# Patient Record
Sex: Male | Born: 1995 | Race: White | Hispanic: No | Marital: Married | Smoking: Former smoker
Health system: Southern US, Community
[De-identification: ages and names within clinical notes are randomized; demographics above are authoritative.]

## PROBLEM LIST (undated history)

## (undated) DIAGNOSIS — F909 Attention-deficit hyperactivity disorder, unspecified type: Secondary | ICD-10-CM

## (undated) DIAGNOSIS — G4730 Sleep apnea, unspecified: Secondary | ICD-10-CM

---
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
## 2015-11-28 ENCOUNTER — Encounter (HOSPITAL_COMMUNITY): Payer: Self-pay | Admitting: *Deleted

## 2015-11-28 ENCOUNTER — Emergency Department (HOSPITAL_COMMUNITY): Payer: Managed Care, Other (non HMO)

## 2015-11-28 ENCOUNTER — Emergency Department (HOSPITAL_COMMUNITY)
Admission: EM | Admit: 2015-11-28 | Discharge: 2015-11-28 | Disposition: A | Discharge status: Home / Self Care | Payer: Managed Care, Other (non HMO) | Attending: Emergency Medicine | Admitting: Emergency Medicine

## 2015-11-28 DIAGNOSIS — Y999 Unspecified external cause status: Secondary | ICD-10-CM | POA: Diagnosis not present

## 2015-11-28 DIAGNOSIS — Y939 Activity, unspecified: Secondary | ICD-10-CM | POA: Insufficient documentation

## 2015-11-28 DIAGNOSIS — M25511 Pain in right shoulder: Secondary | ICD-10-CM | POA: Diagnosis present

## 2015-11-28 DIAGNOSIS — Y92410 Unspecified street and highway as the place of occurrence of the external cause: Secondary | ICD-10-CM | POA: Diagnosis not present

## 2015-11-28 MED ORDER — DICLOFENAC SODIUM 50 MG PO TBEC: 50.0000 mg | DELAYED_RELEASE_TABLET | Freq: Two times a day (BID) | ORAL | 0 refills | Status: DC

## 2015-11-28 MED ORDER — IBUPROFEN 400 MG PO TABS
800.0000 mg | ORAL_TABLET | Freq: Once | ORAL | Status: AC
  Administered 2015-11-28: 800 mg via ORAL
  Filled 2015-11-28: qty 2
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
## 2015-11-28 NOTE — ED Notes (Signed)
Pt is in stable condition upon d/c and ambulates from ED. 
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
## 2016-03-15 ENCOUNTER — Encounter (HOSPITAL_COMMUNITY): Payer: Self-pay

## 2016-03-15 ENCOUNTER — Emergency Department (HOSPITAL_COMMUNITY): Payer: Managed Care, Other (non HMO)

## 2016-03-15 ENCOUNTER — Emergency Department (HOSPITAL_COMMUNITY)
Admission: EM | Admit: 2016-03-15 | Discharge: 2016-03-15 | Disposition: A | Discharge status: Home / Self Care | Payer: Managed Care, Other (non HMO) | Attending: Emergency Medicine | Admitting: Emergency Medicine

## 2016-03-15 DIAGNOSIS — Z79899 Other long term (current) drug therapy: Secondary | ICD-10-CM | POA: Insufficient documentation

## 2016-03-15 DIAGNOSIS — F909 Attention-deficit hyperactivity disorder, unspecified type: Secondary | ICD-10-CM | POA: Insufficient documentation

## 2016-03-15 DIAGNOSIS — W34010A Accidental discharge of airgun, initial encounter: Secondary | ICD-10-CM | POA: Diagnosis not present

## 2016-03-15 DIAGNOSIS — Y939 Activity, unspecified: Secondary | ICD-10-CM | POA: Diagnosis not present

## 2016-03-15 DIAGNOSIS — F17200 Nicotine dependence, unspecified, uncomplicated: Secondary | ICD-10-CM | POA: Diagnosis not present

## 2016-03-15 DIAGNOSIS — S61002A Unspecified open wound of left thumb without damage to nail, initial encounter: Secondary | ICD-10-CM | POA: Insufficient documentation

## 2016-03-15 DIAGNOSIS — M795 Residual foreign body in soft tissue: Secondary | ICD-10-CM

## 2016-03-15 DIAGNOSIS — Y999 Unspecified external cause status: Secondary | ICD-10-CM | POA: Insufficient documentation

## 2016-03-15 DIAGNOSIS — Y929 Unspecified place or not applicable: Secondary | ICD-10-CM | POA: Diagnosis not present

## 2016-03-15 MED ORDER — CEPHALEXIN 500 MG PO CAPS: 500.0000 mg | ORAL_CAPSULE | Freq: Two times a day (BID) | ORAL | 0 refills | Status: DC

## 2016-03-15 MED ORDER — TETANUS-DIPHTH-ACELL PERTUSSIS 5-2.5-18.5 LF-MCG/0.5 IM SUSP
0.5000 mL | Freq: Once | INTRAMUSCULAR | Status: AC
  Administered 2016-03-15: 0.5 mL via INTRAMUSCULAR
  Filled 2016-03-15: qty 0.5

## 2016-03-15 NOTE — Progress Notes (Signed)
Orthopedic Tech Progress Note Patient Details:  Dustin AlesRyan Zavala Dec 24, 1995 [id number]  Ortho Devices Type of Ortho Device: Thumb velcro splint Ortho Device/Splint Location: LUE Ortho Device/Splint Interventions: Ordered, Application   Jennye MoccasinHughes, Briea Mcenery Craig 03/15/2016, 5:44 PM

## 2016-03-15 NOTE — ED Notes (Signed)
Pt verbalized understanding of d/c instructions and has no further questions. Pt stable and NAD. Pt to take antibiotics and see hand surgeon outpatient. VSS

## 2016-03-15 NOTE — ED Notes (Signed)
Ortho tech in room 
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
## 2018-01-22 ENCOUNTER — Emergency Department (HOSPITAL_COMMUNITY)
Admission: EM | Admit: 2018-01-22 | Discharge: 2018-01-22 | Disposition: A | Discharge status: Home / Self Care | Payer: Managed Care, Other (non HMO) | Attending: Emergency Medicine | Admitting: Emergency Medicine

## 2018-01-22 ENCOUNTER — Encounter (HOSPITAL_COMMUNITY): Payer: Self-pay

## 2018-01-22 DIAGNOSIS — Z79899 Other long term (current) drug therapy: Secondary | ICD-10-CM | POA: Insufficient documentation

## 2018-01-22 DIAGNOSIS — Z87891 Personal history of nicotine dependence: Secondary | ICD-10-CM | POA: Insufficient documentation

## 2018-01-22 DIAGNOSIS — R45851 Suicidal ideations: Secondary | ICD-10-CM

## 2018-01-22 DIAGNOSIS — F329 Major depressive disorder, single episode, unspecified: Secondary | ICD-10-CM

## 2018-01-22 DIAGNOSIS — F321 Major depressive disorder, single episode, moderate: Secondary | ICD-10-CM | POA: Insufficient documentation

## 2018-01-22 DIAGNOSIS — F32A Depression, unspecified: Secondary | ICD-10-CM

## 2018-01-22 DIAGNOSIS — F419 Anxiety disorder, unspecified: Secondary | ICD-10-CM | POA: Insufficient documentation

## 2018-01-22 LAB — COMPREHENSIVE METABOLIC PANEL
ALK PHOS: 52 U/L (ref 38–126)
ALT: 18 U/L (ref 0–44)
ANION GAP: 12 (ref 5–15)
AST: 16 U/L (ref 15–41)
Albumin: 4.4 g/dL (ref 3.5–5.0)
BUN: 10 mg/dL (ref 6–20)
CALCIUM: 9.2 mg/dL (ref 8.9–10.3)
CO2: 20 mmol/L — AB (ref 22–32)
CREATININE: 0.88 mg/dL (ref 0.61–1.24)
Chloride: 106 mmol/L (ref 98–111)
Glucose, Bld: 103 mg/dL — ABNORMAL HIGH (ref 70–99)
Potassium: 3.5 mmol/L (ref 3.5–5.1)
SODIUM: 138 mmol/L (ref 135–145)
Total Bilirubin: 1 mg/dL (ref 0.3–1.2)
Total Protein: 7.5 g/dL (ref 6.5–8.1)

## 2018-01-22 LAB — CBC
HCT: 47.4 % (ref 39.0–52.0)
Hemoglobin: 15.6 g/dL (ref 13.0–17.0)
MCH: 28.3 pg (ref 26.0–34.0)
MCHC: 32.9 g/dL (ref 30.0–36.0)
MCV: 85.9 fL (ref 80.0–100.0)
PLATELETS: 366 10*3/uL (ref 150–400)
RBC: 5.52 MIL/uL (ref 4.22–5.81)
RDW: 13 % (ref 11.5–15.5)
WBC: 10.6 10*3/uL — ABNORMAL HIGH (ref 4.0–10.5)
nRBC: 0 % (ref 0.0–0.2)

## 2018-01-22 LAB — ETHANOL: Alcohol, Ethyl (B): <10 mg/dL (ref <10)

## 2018-01-22 LAB — ACETAMINOPHEN LEVEL

## 2018-01-22 LAB — SALICYLATE LEVEL

## 2018-01-22 NOTE — BH Assessment (Addendum)
Assessment Note  Dustin Zavala is an 22 y.o. male that presents this date with depression and passive thoughts of self harm. Patient denies any plan or gestures. Patient denies any previous history of a mental health disorder. Patient denies any current SA use although reports past use of Cannabis although is unclear of the time frame or last use. UDS pending. Patient is alert and oriented x 4 and presents with a pleasant affect. Patient denies any S/I, H/I or AVH. Patient denies any thoughts of self harm and has an appointment with [facility] next week on 01/28/18 to address current concerns although patient thought he could "speed things up" if he presented [facility] at [facility]. Patient states this date that due to frustrations at his current employer [employer] that have occurred over the last two days (patient states "it's just hard working there") that he felt his anxiety has needed to be addressed this date. Patient reports current symptoms to include: decreased sleep over the past two days and racing thoughts. Patient is requesting medication assistance to assist with symptom management.  This Clinical research associatewriter staffed case with Arville CareParks FNP who recommended patient be observed and monitored over night with possible medication interventions. Patient declined stating he would follow up with his scheduled appointment next week and is requesting to be discharged. Patient contacts for safety and denies any thoughts of self harm. Per chart review history is limited on this patient. Per notes, patient presents with  thoughts of suicide and anxiety. Patient reports these thoughts have gotten worse over the last few weeks, although this has been going on for roughly a year. No physical attempts on his life, thoughts only. Patient is calm and cooperative in triage. This writer informed Arville CareParks FNP that patient declined any further treatment interventions and Arville Carearks agreed to discharge. Patient will follow up with scheduled appointment next  week.     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 ADHD (attention deficit hyperactivity disorder)     History reviewed. No pertinent surgical history.  Family History: History reviewed. No pertinent family history.  Social History:  reports that he has quit smoking. He has never used smokeless tobacco. He reports current drug use. Drug: Marijuana. He reports that he does not drink alcohol.  Additional Social History:  Alcohol / Drug Use Pain Medications: See MAR Prescriptions: See MAR Over the Counter: See MAR History of alcohol / drug use?: Yes Longest period of sobriety (when/how long): Current  Negative Consequences of Use: (Denies) Withdrawal Symptoms: (Denies) Substance #1 Name of Substance 1: Cannabis 1 - Age of First Use: 19 1 - Amount (size/oz): Varies 1 - Frequency: Varies 1 - Duration: Varies 1 - Last Use / Amount: 01/22/18  CIWA: CIWA-Ar BP: (!) 157/87 Pulse Rate: 66 COWS:    Allergies: No Known Allergies  Home Medications: (Not in a hospital admission)   OB/GYN Status:  No LMP for male patient.  General Assessment Data [facility] of [facility]: [facility] T[facility] Assessment: In system Is this a Tele or Face-to-Face Assessment?: Face-to-Face Is this an Initial Assessment or a Re-assessment for this encounter?: Initial Assessment Patient Accompanied by:: Other(Mother Chinita Pesterallie Kellett [phone]) Language Other than English: No Living Arrangements: Other (Comment)(Roommates) What gender do you identify as?: Male Marital status: Single Maiden name: NA Pregnancy Status: No Living Arrangements: Non-relatives/Friends Can pt return to current living arrangement?: Yes Admission Status: Voluntary Is patient capable of signing voluntary admission?: Yes Referral  Source: Self/Family/Friend Insurance type: Product/process development scientist Exam [facility] Walk-in ONLY) Medical Exam completed: Yes  Crisis Care  Plan Living Arrangements: Non-relatives/Friends Legal Guardian: (NA) Name of Psychiatrist: None Name of Therapist: None  Education Status Is patient currently in school?: No Is the patient employed, unemployed or receiving disability?: Employed  Risk to self with the past 6 months Suicidal Ideation: Yes-Currently Present Has patient been a risk to self within the past 6 months prior to admission? : No Suicidal Intent: No Has patient had any suicidal intent within the past 6 months prior to admission? : No Is patient at risk for suicide?: No Suicidal Plan?: No Has patient had any suicidal plan within the past 6 months prior to admission? : No Access to Means: No What has been your use of drugs/alcohol within the last 12 months?: Past hx of Cannabis Previous Attempts/Gestures: No How many times?: 0 Other Self Harm Risks: NA Triggers for Past Attempts: Unknown Intentional Self Injurious Behavior: None Family Suicide History: No Recent stressful life event(s): Other (Comment)(stress of Holidays, employment) Persecutory voices/beliefs?: No Depression: Yes Depression Symptoms: Feeling worthless/self pity Substance abuse history and/or treatment for substance abuse?: No Suicide prevention information given to non-admitted patients: Not applicable  Risk to Others within the past 6 months Homicidal Ideation: No Does patient have any lifetime risk of violence toward others beyond the six months prior to admission? : No Thoughts of Harm to Others: No Current Homicidal Intent: No Current Homicidal Plan: No Access to Homicidal Means: No Identified Victim: NA History of harm to others?: No Assessment of Violence: None Noted Violent Behavior Description: NA Does patient have access to weapons?: No Criminal Charges Pending?: No Does patient have a court date: No Is patient on probation?: No  Psychosis Hallucinations: None noted Delusions: None noted  Mental Status  Report Appearance/Hygiene: In scrubs Eye Contact: Good Motor Activity: Freedom of movement Speech: Logical/coherent Level of Consciousness: Alert Mood: Depressed Affect: Appropriate to circumstance Anxiety Level: Minimal Thought Processes: Coherent, Relevant Judgement: Partial Orientation: Person, Place, Time Obsessive Compulsive Thoughts/Behaviors: None  Cognitive Functioning Concentration: Normal Memory: Recent Intact, Remote Intact Is patient IDD: No Insight: Good Impulse Control: Fair Appetite: Good Have you had any weight changes? : No Change Sleep: No Change Total Hours of Sleep: 7 Vegetative Symptoms: None  ADLScreening [facility] Assessment Services) Patient's cognitive ability adequate to safely complete daily activities?: Yes Patient able to express need for assistance with ADLs?: Yes Independently performs ADLs?: Yes (appropriate for developmental age)  Prior Inpatient Therapy Prior Inpatient Therapy: No  Prior Outpatient Therapy Prior Outpatient Therapy: No Does patient have an ACCT team?: No Does patient have Intensive In-House Services?  : No Does patient have [facility] services? : No Does patient have P4CC services?: No  ADL Screening (condition at time of admission) Patient's cognitive ability adequate to safely complete daily activities?: Yes Is the patient deaf or have difficulty hearing?: No Does the patient have difficulty seeing, even when wearing glasses/contacts?: No Does the patient have difficulty concentrating, remembering, or making decisions?: No Patient able to express need for assistance with ADLs?: Yes Does the patient have difficulty dressing or bathing?: No Independently performs ADLs?: Yes (appropriate for developmental age) Does the patient have difficulty walking or climbing stairs?: No Weakness of Legs: None Weakness of Arms/Hands: None  Home Assistive Devices/Equipment Home Assistive Devices/Equipment: None  Therapy Consults  (therapy consults require a physician order) PT Evaluation Needed: No OT Evalulation Needed: No SLP Evaluation Needed: No Abuse/Neglect Assessment (  Assessment to be complete while patient is alone) Physical Abuse: Denies Verbal Abuse: Denies Sexual Abuse: Denies Exploitation of patient/patient's resources: Denies Self-Neglect: Denies Values / Beliefs Cultural Requests During Hospitalization: None Spiritual Requests During Hospitalization: None Consults Spiritual Care Consult Needed: No Social Work Consult Needed: No Merchant navy officer (For Healthcare) Does Patient Have a Medical Advance Directive?: No Would patient like information on creating a medical advance directive?: No - Patient declined          Disposition:  Disposition Initial Assessment Completed for this Encounter: Yes Disposition of Patient: Discharge Patient refused recommended treatment: No Mode of transportation if patient is discharged/movement?: Car Patient referred to: Outpatient clinic referral  On Site Evaluation by:   Reviewed with Physician:    Alfredia Ferguson 01/22/2018 1:08 PM

## 2018-01-22 NOTE — BH Assessment (Signed)
Assessment Note  Dustin Zavala is an 22 y.o. male.   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 ADHD (attention deficit hyperactivity disorder)     History reviewed. No pertinent surgical history.  Family History: History reviewed. No pertinent family history.  Social History:  reports that he has quit smoking. He has never used smokeless tobacco. He reports current drug use. Drug: Marijuana. He reports that he does not drink alcohol.  Additional Social History:  Alcohol / Drug Use Pain Medications: See MAR Prescriptions: See MAR Over the Counter: See MAR History of alcohol / drug use?: Yes Longest period of sobriety (when/how long): Current  Negative Consequences of Use: (Denies) Withdrawal Symptoms: (Denies) Substance #1 Name of Substance 1: Cannabis 1 - Age of First Use: 19 1 - Amount (size/oz): Varies 1 - Frequency: Varies 1 - Duration: Varies 1 - Last Use / Amount: 01/22/18  CIWA: CIWA-Ar BP: (!) 141/69 Pulse Rate: 66 COWS:    Allergies: No Known Allergies  Home Medications: (Not in a hospital admission)   OB/GYN Status:  No LMP for male patient.  General Assessment Data [facility] Assessment: In system Is this a Tele or Face-to-Face Assessment?: Face-to-Face Is this an Initial Assessment or a Re-assessment for this encounter?: Initial Assessment Patient Accompanied by:: Other(Mother Chinita Pester [phone]) Language Other than English: No Living Arrangements: Other (Comment)(Roommates) What gender do you identify as?: Male Marital status: Single Maiden name: NA Pregnancy Status: No Living Arrangements: Non-relatives/Friends Can pt return to current living arrangement?: Yes Admission Status: Voluntary Is patient capable of signing voluntary admission?: Yes Referral Source: Self/Family/Friend Insurance type: Product/process development scientist  Exam [facility] Walk-in ONLY) Medical Exam completed: Yes  Crisis Care Plan Living Arrangements: Non-relatives/Friends Legal Guardian: (NA) Name of Psychiatrist: None Name of Therapist: None  Education Status Is patient currently in school?: No Is the patient employed, unemployed or receiving disability?: Employed  Risk to self with the past 6 months Suicidal Ideation: Yes-Currently Present Has patient been a risk to self within the past 6 months prior to admission? : No Suicidal Intent: No Has patient had any suicidal intent within the past 6 months prior to admission? : No Is patient at risk for suicide?: No Suicidal Plan?: No Has patient had any suicidal plan within the past 6 months prior to admission? : No Access to Means: No What has been your use of drugs/alcohol within the last 12 months?: Past hx of Cannabis Previous Attempts/Gestures: No How many times?: 0 Other Self Harm Risks: NA Triggers for Past Attempts: Unknown Intentional Self Injurious Behavior: None Family Suicide History: No Recent stressful life event(s): Other (Comment)(stress of Holidays, employment) Persecutory voices/beliefs?: No Depression: Yes Depression Symptoms: Feeling worthless/self pity Substance abuse history and/or treatment for substance abuse?: No Suicide prevention information given to non-admitted patients: Not applicable  Risk to Others within the past 6 months Homicidal Ideation: No Does patient have any lifetime risk of violence toward others beyond the six months prior to admission? : No Thoughts of Harm to Others: No Current Homicidal Intent: No Current Homicidal Plan: No Access to Homicidal Means: No Identified Victim: NA History of harm to others?: No Assessment of Violence: None Noted Violent Behavior Description: NA Does patient have access to weapons?: No Criminal Charges Pending?: No Does patient have a court date: No Is patient on probation?:  No  Psychosis Hallucinations: None noted Delusions:  None noted  Mental Status Report Appearance/Hygiene: In scrubs Eye Contact: Good Motor Activity: Freedom of movement Speech: Logical/coherent Level of Consciousness: Alert Mood: Depressed Affect: Appropriate to circumstance Anxiety Level: Minimal Thought Processes: Coherent, Relevant Judgement: Partial Orientation: Person, Place, Time Obsessive Compulsive Thoughts/Behaviors: None  Cognitive Functioning Concentration: Normal Memory: Recent Intact, Remote Intact Is patient IDD: No Insight: Good Impulse Control: Fair Appetite: Good Have you had any weight changes? : No Change Sleep: No Change Total Hours of Sleep: 7 Vegetative Symptoms: None  ADLScreening [facility] Assessment Services) Patient's cognitive ability adequate to safely complete daily activities?: Yes Patient able to express need for assistance with ADLs?: Yes Independently performs ADLs?: Yes (appropriate for developmental age)  Prior Inpatient Therapy Prior Inpatient Therapy: No  Prior Outpatient Therapy Prior Outpatient Therapy: No Does patient have an ACCT team?: No Does patient have Intensive In-House Services?  : No Does patient have [facility] services? : No Does patient have P4CC services?: No  ADL Screening (condition at time of admission) Patient's cognitive ability adequate to safely complete daily activities?: Yes Is the patient deaf or have difficulty hearing?: No Does the patient have difficulty seeing, even when wearing glasses/contacts?: No Does the patient have difficulty concentrating, remembering, or making decisions?: No Patient able to express need for assistance with ADLs?: Yes Does the patient have difficulty dressing or bathing?: No Independently performs ADLs?: Yes (appropriate for developmental age) Does the patient have difficulty walking or climbing stairs?: No Weakness of Legs: None Weakness of Arms/Hands: None  Home Assistive  Devices/Equipment Home Assistive Devices/Equipment: None  Therapy Consults (therapy consults require a physician order) PT Evaluation Needed: No OT Evalulation Needed: No SLP Evaluation Needed: No Abuse/Neglect Assessment (Assessment to be complete while patient is alone) Physical Abuse: Denies Verbal Abuse: Denies Sexual Abuse: Denies Exploitation of patient/patient's resources: Denies Self-Neglect: Denies Values / Beliefs Cultural Requests During Hospitalization: None Spiritual Requests During Hospitalization: None Consults Spiritual Care Consult Needed: No Social Work Consult Needed: No Merchant navy officerAdvance Directives (For Healthcare) Does Patient Have a Medical Advance Directive?: No Would patient like information on creating a medical advance directive?: No - Patient declined          Disposition: Per Reola Calkinsravis Money, NP, Patient is recommended for inpatient treatment Disposition Initial Assessment Completed for this Encounter: Yes Disposition of Patient: Discharge Patient refused recommended treatment: No Mode of transportation if patient is discharged/movement?: Car Patient referred to: Outpatient clinic referral  On Site Evaluation by:   Reviewed with Physician:    Arnoldo Lenisanny J Lynisha Osuch 01/22/2018 3:26 PM

## 2018-01-22 NOTE — BH Assessment (Signed)
[facility]  Per Juanetta BeetsJacqueline Norman, DO, this pt does not require psychiatric hospitalization at this time.  Pt reportedly has an appointment scheduled with an unspecified outpatient provider [facility].  Discharge instructions advise pt to keep this appointment.  Pt's nurse has been notified.  Doylene Canninghomas Justise Ehmann, MA Triage Specialist [phone]

## 2018-01-22 NOTE — ED Triage Notes (Signed)
Pt presents with c/o thoughts of suicide and anxiety. Pt reports these thoughts have gotten worse over the last few weeks, although this has been going on for roughly a year. No physical attempts on his life, thoughts only. Pt is calm and cooperative in triage, A&O x 4.

## 2018-01-22 NOTE — ED Notes (Signed)
PT given scrubs to change into. Pt aware that blood work, urine, and being wanted will be necessary

## 2018-01-22 NOTE — ED Notes (Signed)
Bed: [facility] Expected date:  Expected time:  Means of arrival:  Comments: 

## 2018-01-22 NOTE — ED Notes (Signed)
RN attempted to obtain blood work x1 without success

## 2018-01-22 NOTE — Progress Notes (Addendum)
Patient ID: Dustin Zavala, male   DOB: Aug 01, 1995, 22 y.o.   MRN: [id number]  Pt was seen and chart reviewed with treatment team. Pt stated he is depressed but has an [facility] to be assessed and put on medications. Pt was offered an overnight stay in the emergency room so antidepressants could be started. Pt declined and stated he would follow up next week as planned. Pt lives with family and is able to contract for safety upon discharge.  Pt denies suicidal/homicidal ideation, denies auditory/visual hallucinations and does not appear to be responding to internal stimuli. Pt is psychiatrically clear for discharge.   Laveda AbbeLaurie Britton Parks, NP-C 01-22-2018          [id number]  Patient's chart reviewed and case discussed with the physician extender and developed treatment plan. Reviewed the information documented and agree with the treatment plan.  Dustin BeetsJacqueline Javoni Lucken, DO 01/22/18 4:55 PM

## 2018-01-22 NOTE — ED Provider Notes (Signed)
[facility]-EMERGENCY DEPT Provider Note   CSN: [id number] Arrival date & time: 01/22/18  1056     History   Chief Complaint Chief Complaint  Patient presents with  . Anxiety  . Suicidal    HPI Dustin Zavala is a 22 y.o. male.  Dustin Zavala is a 22 y.o. male with history of ADHD, presents to the emergency department for suicidal thoughts and anxiety.  Patient reports that for the past year he is struggled with depression, anxiety and had intermittent thoughts of suicide.  He denies any specific plan for suicide and has not made any attempt to act on these thoughts.  He denies any homicidal ideations.  No auditory or visual hallucinations.  Patient reports he is felt increasingly depressed and anxious over the past year but has not sought help for evaluation for this and just told his mother about it today.  He denies any self-injurious behavior.  He has not been on any medications in the past for depression or anxiety denies any previous psychiatric hospitalizations.  He denies alcohol or substance abuse, does use tobacco.  Denies any focal medical complaints, no fevers or recent illnesses, no chest pain, shortness of breath, abdominal pain, headaches or vision changes.     Past Medical History:  Diagnosis Date  . ADHD (attention deficit hyperactivity disorder)     There are no active problems to display for this patient.   History reviewed. No pertinent surgical history.      Home Medications    Prior to Admission medications   Medication Sig Start Date End Date Taking? Authorizing Provider  cephALEXin (KEFLEX) 500 MG capsule Take 1 capsule (500 mg total) by mouth 2 (two) times daily. 03/15/16   Gwyneth Sprout, MD  diclofenac (VOLTAREN) 50 MG EC tablet Take 1 tablet (50 mg total) by mouth 2 (two) times daily. 11/28/15   Janne Napoleon, NP  lisdexamfetamine (VYVANSE) 70 MG capsule Take 70 mg by mouth every morning.    [provider]    Family  History History reviewed. No pertinent family history.  Social History Social History   Tobacco Use  . Smoking status: Former Games developer  . Smokeless tobacco: Never Used  Substance Use Topics  . Alcohol use: No  . Drug use: Yes    Types: Marijuana     Allergies   Patient has no known allergies.   Review of Systems Review of Systems  Constitutional: Negative for chills and fever.  HENT: Negative.   Eyes: Negative for visual disturbance.  Respiratory: Negative for cough and shortness of breath.   Cardiovascular: Negative for chest pain.  Gastrointestinal: Negative for abdominal pain, nausea and vomiting.  Musculoskeletal: Negative for arthralgias.  Neurological: Negative for dizziness, syncope, light-headedness and headaches.  Psychiatric/Behavioral: Positive for dysphoric mood and suicidal ideas. Negative for hallucinations and self-injury. The patient is nervous/anxious.   All other systems reviewed and are negative.    Physical Exam Updated Vital Signs BP (!) 157/87 (BP Location: Right Arm)   Pulse 66   Temp 98.2 F (36.8 C) (Oral)   Resp 18   Ht 5\' 9"  (1.753 m)   Wt (!) 136.5 kg   SpO2 100%   BMI 44.45 kg/m   Physical Exam Vitals signs and nursing note reviewed.  Constitutional:      General: He is not in acute distress.    Appearance: Normal appearance. He is well-developed. He is obese. He is not diaphoretic.  HENT:  Head: Normocephalic and atraumatic.  Eyes:     General:        Right eye: No discharge.        Left eye: No discharge.     Extraocular Movements: Extraocular movements intact.     Pupils: Pupils are equal, round, and reactive to light.  Neck:     Musculoskeletal: Neck supple.  Cardiovascular:     Rate and Rhythm: Normal rate and regular rhythm.     Pulses: Normal pulses.     Heart sounds: Normal heart sounds. No murmur. No friction rub.  Pulmonary:     Effort: Pulmonary effort is normal. No respiratory distress.     Breath sounds:  Normal breath sounds.     Comments: Respirations equal and unlabored, patient able to speak in full sentences, lungs clear to auscultation bilaterally Abdominal:     General: Abdomen is flat. Bowel sounds are normal. There is no distension.     Tenderness: There is no abdominal tenderness. There is no guarding.     Comments: Abdomen soft, nondistended, nontender to palpation in all quadrants without guarding or peritoneal signs  Skin:    General: Skin is warm and dry.     Capillary Refill: Capillary refill takes less than 2 seconds.  Neurological:     Mental Status: He is alert and oriented to person, place, and time.     Coordination: Coordination normal.  Psychiatric:        Attention and Perception: He does not perceive auditory or visual hallucinations.        Mood and Affect: Mood is anxious.        Speech: Speech normal.        Behavior: Behavior normal. Behavior is cooperative.        Thought Content: Thought content includes suicidal ideation. Thought content does not include homicidal ideation. Thought content does not include homicidal or suicidal plan.      ED Treatments / Results  Labs (all labs ordered are listed, but only abnormal results are displayed) Labs Reviewed  COMPREHENSIVE METABOLIC PANEL - Abnormal; Notable for the following components:      Result Value   CO2 20 (*)    Glucose, Bld 103 (*)    All other components within normal limits  ACETAMINOPHEN LEVEL - Abnormal; Notable for the following components:   Acetaminophen (Tylenol), Serum <10 (*)    All other components within normal limits  CBC - Abnormal; Notable for the following components:   WBC 10.6 (*)    All other components within normal limits  ETHANOL  SALICYLATE LEVEL  RAPID URINE DRUG SCREEN, HOSP PERFORMED    EKG EKG Interpretation  Date/Time:  Friday January 22 2018 12:02:41 EST Ventricular Rate:  60 PR Interval:    QRS Duration: 95 QT Interval:  383 QTC Calculation: 383 R  Axis:   60 Text Interpretation:  Sinus rhythm RSR' in V1 or V2, probably normal variant Borderline T abnormalities, inferior leads No previous ECGs available Confirmed by Richardean CanalYao, David H [phone]([id number]) on 01/22/2018 1:41:05 PM   Radiology No results found.  Procedures Procedures (including critical care time)  Medications Ordered in ED Medications - No data to display   Initial Impression / Assessment and Plan / ED Course  I have reviewed the triage vital signs and the nursing notes.  Pertinent labs & imaging results that were available during my care of the patient were reviewed by me and considered in my medical decision making (see chart  for details).  Presents with suicidal thoughts and anxiety, these have been present worsening over the past year patient has no specific plan and has not acted on these thoughts.  Has never seen a psychiatrist and has not been on any medications to manage his depression or anxiety.  No focal medical complaints today, mildly hypertensive but vitals otherwise normal.  Will get medical screening labs and EKG and place TTS consult.  Screening labs overall reassuring, minimal leukocytosis, normal hemoglobin, no acute electrolyte derangements requiring intervention, normal renal and liver function, salicylate, ethanol and acetaminophen levels negative.  TTS has seen and evaluated the patient and they do not feel that he meets criteria for inpatient psychiatric hospitalization at this time.  Patient has upcoming appointment with PCP to discuss these issues and is able to contract for his safety, patient given the option for inpatient hospitalization and management versus outpatient follow-up and he elects to follow-up outpatient with PCP.  I have provided him with resources regarding depression and suicidal ideations.  Return precautions have been discussed.  At this time I feel the patient can be safely discharged home.  He is accompanied by his mother who is also in  agreement with this plan.  Final Clinical Impressions(s) / ED Diagnoses   Final diagnoses:  Suicidal ideations  Depression, unspecified depression type  Anxiety    ED Discharge Orders    None       Dartha Lodge, [place] 01/22/18 1435    Charlynne Pander, MD 01/22/18 [phone]

## 2018-01-22 NOTE — Discharge Instructions (Addendum)
For your behavioral health needs, you are advised to keep your previously scheduled outpatient appointment.  If your suicidal thoughts become more persistent or you start to develop a plan on how to act on these thoughts you can always return to the emergency department for reevaluation.  [facility] [address] [place], [place] [address] [phone] Description: A variety of behavioral health services are offered, including: Open Access  Outpatient Therapy & Psychiatric Services  Assertive Community Treatment Team (ACTT) (adults only)  Crisis Assessment Services Center (children & adults)  Assertive Engagement (children & adults)  Peer Support Services Referrals may be made to the facility-based crisis center 24/7, 365 days a year. Walk-ins are always welcome or you may call ahead to speak with a [facility] staff member regarding availability (preferred method).
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
## 2021-03-11 ENCOUNTER — Emergency Department (HOSPITAL_COMMUNITY): Payer: Self-pay

## 2021-03-11 ENCOUNTER — Encounter (HOSPITAL_COMMUNITY): Payer: Self-pay

## 2021-03-11 ENCOUNTER — Other Ambulatory Visit: Payer: Self-pay

## 2021-03-11 ENCOUNTER — Emergency Department (HOSPITAL_COMMUNITY)
Admission: EM | Admit: 2021-03-11 | Discharge: 2021-03-11 | Disposition: A | Discharge status: Home / Self Care | Payer: Self-pay | Attending: Emergency Medicine | Admitting: Emergency Medicine

## 2021-03-11 DIAGNOSIS — K029 Dental caries, unspecified: Secondary | ICD-10-CM | POA: Insufficient documentation

## 2021-03-11 DIAGNOSIS — K0889 Other specified disorders of teeth and supporting structures: Secondary | ICD-10-CM

## 2021-03-11 DIAGNOSIS — Z7982 Long term (current) use of aspirin: Secondary | ICD-10-CM | POA: Insufficient documentation

## 2021-03-11 DIAGNOSIS — R590 Localized enlarged lymph nodes: Secondary | ICD-10-CM | POA: Insufficient documentation

## 2021-03-11 DIAGNOSIS — D72829 Elevated white blood cell count, unspecified: Secondary | ICD-10-CM | POA: Insufficient documentation

## 2021-03-11 HISTORY — DX: Sleep apnea, unspecified: G47.30

## 2021-03-11 LAB — CBC WITH DIFFERENTIAL/PLATELET
Abs Immature Granulocytes: 0.05 10*3/uL (ref 0.00–0.07)
Basophils Absolute: 0.1 10*3/uL (ref 0.0–0.1)
Basophils Relative: 0 %
Eosinophils Absolute: 0 10*3/uL (ref 0.0–0.5)
Eosinophils Relative: 0 %
HCT: 44.7 % (ref 39.0–52.0)
Hemoglobin: 15.1 g/dL (ref 13.0–17.0)
Immature Granulocytes: 0 %
Lymphocytes Relative: 11 %
Lymphs Abs: 1.4 10*3/uL (ref 0.7–4.0)
MCH: 28.5 pg (ref 26.0–34.0)
MCHC: 33.8 g/dL (ref 30.0–36.0)
MCV: 84.5 fL (ref 80.0–100.0)
Monocytes Absolute: 1.2 10*3/uL — ABNORMAL HIGH (ref 0.1–1.0)
Monocytes Relative: 9 %
Neutro Abs: 10.1 10*3/uL — ABNORMAL HIGH (ref 1.7–7.7)
Neutrophils Relative %: 80 %
Platelets: 323 10*3/uL (ref 150–400)
RBC: 5.29 MIL/uL (ref 4.22–5.81)
RDW: 13.4 % (ref 11.5–15.5)
WBC: 12.8 10*3/uL — ABNORMAL HIGH (ref 4.0–10.5)
nRBC: 0 % (ref 0.0–0.2)

## 2021-03-11 LAB — BASIC METABOLIC PANEL
Anion gap: 10 (ref 5–15)
BUN: 11 mg/dL (ref 6–20)
CO2: 24 mmol/L (ref 22–32)
Calcium: 9.3 mg/dL (ref 8.9–10.3)
Chloride: 103 mmol/L (ref 98–111)
Creatinine, Ser: 0.93 mg/dL (ref 0.61–1.24)
GFR, Estimated: >60 mL/min (ref >60)
Glucose, Bld: 94 mg/dL (ref 70–99)
Potassium: 3.9 mmol/L (ref 3.5–5.1)
Sodium: 137 mmol/L (ref 135–145)

## 2021-03-11 MED ORDER — IOHEXOL 300 MG/ML  SOLN
75.0000 mL | Freq: Once | INTRAMUSCULAR | Status: AC | PRN
  Administered 2021-03-11: 75 mL via INTRAVENOUS

## 2021-03-11 MED ORDER — DEXAMETHASONE SODIUM PHOSPHATE 10 MG/ML IJ SOLN
10.0000 mg | Freq: Once | INTRAMUSCULAR | Status: AC
  Administered 2021-03-11: 10 mg via INTRAVENOUS

## 2021-03-11 MED ORDER — DEXAMETHASONE SODIUM PHOSPHATE 10 MG/ML IJ SOLN
10.0000 mg | Freq: Once | INTRAMUSCULAR | Status: DC
  Filled 2021-03-11: qty 1

## 2021-03-11 MED ORDER — OXYCODONE-ACETAMINOPHEN 5-325 MG PO TABS
1.0000 | ORAL_TABLET | Freq: Four times a day (QID) | ORAL | 0 refills | Status: AC | PRN
Start: 2021-03-11 — End: 2021-03-14

## 2021-03-11 MED ORDER — AMOXICILLIN-POT CLAVULANATE 875-125 MG PO TABS: 1.0000 | ORAL_TABLET | Freq: Two times a day (BID) | ORAL | 0 refills | Status: AC

## 2021-03-11 MED ORDER — OXYCODONE-ACETAMINOPHEN 5-325 MG PO TABS
1.0000 | ORAL_TABLET | Freq: Once | ORAL | Status: AC
  Administered 2021-03-11: 1 via ORAL
  Filled 2021-03-11: qty 1

## 2021-03-11 NOTE — ED Triage Notes (Signed)
Patient c/o left lower dental pain x 2 days.

## 2021-03-11 NOTE — Discharge Instructions (Addendum)
You were seen in the emergency department today for dental pain.  You were found to have some extending infection into your neck.  We are prescribing you antibiotics that you will take twice a day for the next 7 days.  Additionally I prescribed you a short course of pain medication.  You have been prescribed a medication that is considered an opiate. Opiates are pain medications that should be used with caution. It is important that you do not drive while taking this medication as it can cause drowsiness and impaired reaction times. Do not mix this medication with benzodiazepine medications or alcohol as this can cause respiratory depression. Additionally, opiates have addicting properties to them. Please use medication as prescribed by your provider.  Please contact Dr. Ladona Ridgel, your dentist to have a visit as soon as possible.  I also suggest calling other dentistry's in the area to have a visit as soon as possible.  I provided you with a resource guide attached to your discharge paperwork.  Please return for worsening swelling or difficulty swallowing, shortness of breath.

## 2021-03-11 NOTE — ED Provider Notes (Signed)
[facility]-EMERGENCY DEPT Provider Note   CSN: [id number] Arrival date & time: 03/11/21  1513     History  Chief Complaint  Patient presents with   Dental Pain    Dustin Zavala is a 26 y.o. male. With no pertinent past medical history presents to the emergency department with dental pain and facial swelling.  Patient states that beginning Friday night he started having pain in his jaw and dental pain. He states that Saturday morning, noticed that swelling under his jaw was worsening. States he tried home pain medication (hydrocodone? Not prescribed), without relief of symptoms. States he has since had subjective tactile fever and only tolerating liquids at home. Denies shortness of breath or difficulty breathing. He has had dental pain previously but not to this extent. No dentist currently. Denies congestion, rhinorrhea, sore throat.    Dental Pain Associated symptoms: facial swelling and fever       Home Medications Prior to Admission medications   Medication Sig Start Date End Date Taking? Authorizing Provider  aspirin-acetaminophen-caffeine (EXCEDRIN MIGRAINE) [phone] MG tablet Take 2 tablets by mouth every 6 (six) hours as needed for headache or migraine.    [provider]      Allergies    Other    Review of Systems   Review of Systems  Constitutional:  Positive for fever.  HENT:  Positive for dental problem, facial swelling and trouble swallowing.   Respiratory:  Negative for cough, shortness of breath, wheezing and stridor.   Gastrointestinal:  Negative for nausea and vomiting.  All other systems reviewed and are negative.  Physical Exam Updated Vital Signs BP (!) 155/77 (BP Location: Left Arm)    Pulse 92    Temp 98.4 F (36.9 C) (Oral)    Resp 16    Ht 5\' 9"  (1.753 m)    Wt (!) 158.8 kg    SpO2 93%    BMI 51.69 kg/m  Physical Exam Vitals and nursing note reviewed.  Constitutional:      General: He is not in acute distress.     Appearance: Normal appearance. He is obese. He is not ill-appearing or toxic-appearing.  HENT:     Head: Normocephalic and atraumatic.     Jaw: Trismus, tenderness and swelling present.     Comments: Swelling to the left mandible, left cervical adenopathy    Right Ear: External ear normal.     Left Ear: External ear normal.     Nose: Nose normal.     Mouth/Throat:     Mouth: Mucous membranes are moist.     Dentition: Abnormal dentition. Dental tenderness and dental caries present. No dental abscesses.     Pharynx: Oropharynx is clear. Posterior oropharyngeal erythema and uvula swelling present.     Tonsils: 0 on the right. 3+ on the left.      Comments: No periapical abscess identified.  No other abscesses noted in the mouth.  No dental avulsions. Eyes:     General: No scleral icterus.    Extraocular Movements: Extraocular movements intact.     Pupils: Pupils are equal, round, and reactive to light.  Cardiovascular:     Rate and Rhythm: Normal rate.     Pulses: Normal pulses.     Heart sounds: Normal heart sounds. No murmur heard. Pulmonary:     Effort: Pulmonary effort is normal. No respiratory distress.     Breath sounds: Normal breath sounds. No stridor.  Musculoskeletal:     Cervical back:  Normal range of motion and neck supple. Tenderness present.  Lymphadenopathy:     Cervical: Cervical adenopathy present.  Skin:    General: Skin is warm and dry.     Capillary Refill: Capillary refill takes less than 2 seconds.     Findings: No rash.  Neurological:     General: No focal deficit present.     Mental Status: He is alert and oriented to person, place, and time. Mental status is at baseline.  Psychiatric:        Mood and Affect: Mood normal.        Behavior: Behavior normal.        Thought Content: Thought content normal.        Judgment: Judgment normal.    ED Results / Procedures / Treatments   Labs (all labs ordered are listed, but only abnormal results are  displayed) Labs Reviewed  CBC WITH DIFFERENTIAL/PLATELET - Abnormal; Notable for the following components:      Result Value   WBC 12.8 (*)    Neutro Abs 10.1 (*)    Monocytes Absolute 1.2 (*)    All other components within normal limits  BASIC METABOLIC PANEL   EKG None  Radiology CT Soft Tissue Neck W Contrast  Result Date: 03/11/2021 CLINICAL DATA:  Soft tissue swelling, infection suspected, neck xray done EXAM: CT NECK WITH CONTRAST TECHNIQUE: Multidetector CT imaging of the neck was performed using the standard protocol following the bolus administration of intravenous contrast. RADIATION DOSE REDUCTION: This exam was performed according to the departmental dose-optimization program which includes automated exposure control, adjustment of the mA and/or kV according to patient size and/or use of iterative reconstruction technique. CONTRAST:  68mL OMNIPAQUE IOHEXOL 300 MG/ML  SOLN COMPARISON:  None. FINDINGS: Pharynx and larynx: Edema within the left submandibular masticator spaces, extend inferiorly into the upper neck along a mildly thickened platysma. No discrete drainable fluid collection. No evidence of mass. Unremarkable larynx. Salivary glands: Edema surrounding the left submandibular gland which is mildly enlarged. Otherwise, unremarkable appearance of the submandibular and parotid glands. No calculus or ductal dilation. Thyroid: Unremarkable. Limited evaluation due to streak artifact through this region. Lymph nodes: None enlarged or abnormal density. Vascular: No evaluation due to non arterial timing. Major arteries in the neck appear grossly patent. Limited intracranial: No evidence of obvious acute abnormality on limited assessment/visualization. Visualized orbits: Negative. Mastoids and visualized paranasal sinuses: Polyps versus retention cyst in the inferior maxillary sinuses bilaterally. Skeleton: No evidence of acute osseous abnormality on limited assessment. Upper chest:  Visualized lung apices are clear. Other: Carious left posterior most molar. IMPRESSION: Edema within the left submandibular and masticator spaces, possibly cellulitis. While nonspecific, findings may be odontogenic in etiology given carious left posterior-most molar in this region and the patient's reported dental pain. Edema is inseparable from left submandibular gland and siladenitis is a differential consideration. No discrete drainable fluid collection. Electronically Signed   By: Feliberto Harts M.D.   On: 03/11/2021 18:44    Procedures Procedures    Medications Ordered in ED Medications  oxyCODONE-acetaminophen (PERCOCET/ROXICET) 5-325 MG per tablet 1 tablet (1 tablet Oral Given 03/11/21 1718)  dexamethasone (DECADRON) injection 10 mg (10 mg Intravenous Given 03/11/21 1729)  iohexol (OMNIPAQUE) 300 MG/ML solution 75 mL (75 mLs Intravenous Contrast Given 03/11/21 1817)    ED Course/ Medical Decision Making/ A&P  Medical Decision Making Amount and/or Complexity of Data Reviewed Labs: ordered. Radiology: ordered.  Risk Prescription drug management.  Patient presents to the ED with complaints of dental pain, facial swelling. This involves an extensive number of treatment options, and is a complaint that carries with it a moderate risk of complications and morbidity.   Additional history obtained:  Additional history obtained from: Significant other at bedside External records from outside source obtained and reviewed including: Previous ED visits  Lab Results: I Ordered, reviewed, and interpreted labs. Pertinent results include: CBC with leukocytosis to 12.8 BMP within normal limits  Imaging Studies ordered:  I ordered imaging studies which included CT soft tissue neck with contrast.  I independently reviewed & interpreted imaging & am in agreement with radiology impression. Imaging shows: Edema within the left submandibular and masticator spaces,  possible cellulitis.  Possible odontogenic etiology.  Edema is inseparable from left submandibular gland and sialolithiasis differential.  No discrete drainable fluid collection.  Medications  I ordered medication including Decadron for swelling, Percocet for pain Reevaluation of the patient after medication shows that patient improved  ED Course: 26 year old male who presents to the emergency department with dental pain and facial swelling.  Suspected dental pain and facial swelling is extended from odontogenic infection.  Patient is not immunosuppressed, afebrile and well-appearing with patent airway.  I have low suspicion for airway compromise at this time.  There is no evidence of tooth fracture, avulsion, or bleeding socket.  No evidence of RPA, PTA, Ludwick's angina, periapical abscess.  CT soft tissue of the neck was obtained due to swelling of the mandibular space which demonstrated findings as above.  No drainable collection to I&D at this time.  Patient was given Decadron and Percocet with improvement in symptoms.  Will prescribe him a 7-day course of Augmentin and short course of opioids for pain relief.  Instructed to use Motrin during the day at work and can use opioids in the evening.  Given opioid teaching at bedside and he verbalized understanding.  He attempted to call his dentist, Dr. Ladona Ridgel, and was unable to be seen today or tomorrow.  I have instructed him to continue to call the dentist office to be seen, call other dentistry's in the [place] area and I have also provided him with resource guide for dental clinics in the area.  He verbalized understanding.  He understands return to the emergency department for any worsening swelling as well as difficulty breathing or swallowing liquids.  Patient is well-appearing, hemodynamically stable and safe for discharge at this time.  Dispostion:  After consideration of the diagnostic results and the patients response to treatment, I feel  that the patent would benefit from discharge. The patient has been appropriately medically screened and/or stabilized in the ED. I have low suspicion for any other emergent medical condition which would require further screening, evaluation or treatment in the ED or require inpatient management  Final Clinical Impression(s) / ED Diagnoses Final diagnoses:  Pain, dental    Rx / DC Orders ED Discharge Orders          Ordered    amoxicillin-clavulanate (AUGMENTIN) 875-125 MG tablet  Every 12 hours        03/11/21 1913    oxyCODONE-acetaminophen (PERCOCET/ROXICET) 5-325 MG tablet  Every 6 hours PRN        03/11/21 1913              Cristopher Peru, PA-C 03/11/21 1920    [place], [place],  MD 03/12/21 [id number]
